# Patient Record
Sex: Female | Born: 1960 | Race: White | Hispanic: No | Marital: Single | Smoking: Current every day smoker
Health system: Southern US, Community
[De-identification: ages and names within clinical notes are randomized; demographics above are authoritative.]

## PROBLEM LIST (undated history)

## (undated) DIAGNOSIS — K8020 Calculus of gallbladder without cholecystitis without obstruction: Secondary | ICD-10-CM

## (undated) DIAGNOSIS — M810 Age-related osteoporosis without current pathological fracture: Secondary | ICD-10-CM

## (undated) DIAGNOSIS — I7300 Raynaud's syndrome without gangrene: Secondary | ICD-10-CM

## (undated) DIAGNOSIS — G35 Multiple sclerosis: Secondary | ICD-10-CM

## (undated) DIAGNOSIS — M069 Rheumatoid arthritis, unspecified: Secondary | ICD-10-CM

---

## 2018-03-20 ENCOUNTER — Emergency Department (HOSPITAL_COMMUNITY): Payer: Medicaid Other

## 2018-03-20 ENCOUNTER — Emergency Department (HOSPITAL_COMMUNITY)
Admission: EM | Admit: 2018-03-20 | Discharge: 2018-03-20 | Disposition: A | Discharge status: Home / Self Care | Payer: Medicaid Other | Attending: Emergency Medicine | Admitting: Emergency Medicine

## 2018-03-20 ENCOUNTER — Encounter (HOSPITAL_COMMUNITY): Payer: Self-pay | Admitting: Emergency Medicine

## 2018-03-20 DIAGNOSIS — G35 Multiple sclerosis: Secondary | ICD-10-CM | POA: Diagnosis not present

## 2018-03-20 DIAGNOSIS — F17210 Nicotine dependence, cigarettes, uncomplicated: Secondary | ICD-10-CM | POA: Insufficient documentation

## 2018-03-20 DIAGNOSIS — Z79899 Other long term (current) drug therapy: Secondary | ICD-10-CM | POA: Insufficient documentation

## 2018-03-20 DIAGNOSIS — R531 Weakness: Secondary | ICD-10-CM | POA: Diagnosis present

## 2018-03-20 HISTORY — DX: Calculus of gallbladder without cholecystitis without obstruction: K80.20

## 2018-03-20 HISTORY — DX: Multiple sclerosis: G35

## 2018-03-20 HISTORY — DX: Rheumatoid arthritis, unspecified: M06.9

## 2018-03-20 HISTORY — DX: Age-related osteoporosis without current pathological fracture: M81.0

## 2018-03-20 HISTORY — DX: Raynaud's syndrome without gangrene: I73.00

## 2018-03-20 LAB — CBC WITH DIFFERENTIAL/PLATELET
ABS IMMATURE GRANULOCYTES: 0.02 10*3/uL (ref 0.00–0.07)
Basophils Absolute: 0 10*3/uL (ref 0.0–0.1)
Basophils Relative: 0 %
Eosinophils Absolute: 0.2 10*3/uL (ref 0.0–0.5)
Eosinophils Relative: 2 %
HCT: 42.8 % (ref 36.0–46.0)
Hemoglobin: 13.7 g/dL (ref 12.0–15.0)
Immature Granulocytes: 0 %
Lymphocytes Relative: 42 %
Lymphs Abs: 3.2 10*3/uL (ref 0.7–4.0)
MCH: 30.8 pg (ref 26.0–34.0)
MCHC: 32 g/dL (ref 30.0–36.0)
MCV: 96.2 fL (ref 80.0–100.0)
Monocytes Absolute: 0.7 10*3/uL (ref 0.1–1.0)
Monocytes Relative: 10 %
Neutro Abs: 3.6 10*3/uL (ref 1.7–7.7)
Neutrophils Relative %: 46 %
Platelets: 383 10*3/uL (ref 150–400)
RBC: 4.45 MIL/uL (ref 3.87–5.11)
RDW: 13 % (ref 11.5–15.5)
WBC: 7.8 10*3/uL (ref 4.0–10.5)
nRBC: 0 % (ref 0.0–0.2)

## 2018-03-20 LAB — COMPREHENSIVE METABOLIC PANEL
ALT: 13 U/L (ref 0–44)
AST: 16 U/L (ref 15–41)
Albumin: 4 g/dL (ref 3.5–5.0)
Alkaline Phosphatase: 32 U/L — ABNORMAL LOW (ref 38–126)
Anion gap: 8 (ref 5–15)
BILIRUBIN TOTAL: 0.5 mg/dL (ref 0.3–1.2)
BUN: 24 mg/dL — ABNORMAL HIGH (ref 6–20)
CHLORIDE: 111 mmol/L (ref 98–111)
CO2: 21 mmol/L — ABNORMAL LOW (ref 22–32)
Calcium: 8.7 mg/dL — ABNORMAL LOW (ref 8.9–10.3)
Creatinine, Ser: 0.81 mg/dL (ref 0.44–1.00)
GFR calc Af Amer: >60 mL/min (ref >60)
Glucose, Bld: 82 mg/dL (ref 70–99)
Potassium: 4 mmol/L (ref 3.5–5.1)
Sodium: 140 mmol/L (ref 135–145)
Total Protein: 6.4 g/dL — ABNORMAL LOW (ref 6.5–8.1)

## 2018-03-20 MED ORDER — HYDROCODONE-ACETAMINOPHEN 5-325 MG PO TABS
1.0000 | ORAL_TABLET | Freq: Once | ORAL | Status: AC
  Administered 2018-03-20: 1 via ORAL
  Filled 2018-03-20: qty 1

## 2018-03-20 MED ORDER — GABAPENTIN 300 MG PO CAPS: 300.0000 mg | ORAL_CAPSULE | Freq: Four times a day (QID) | ORAL | 0 refills | Status: AC

## 2018-03-20 MED ORDER — HYDROCODONE-ACETAMINOPHEN 5-325 MG PO TABS: 1.0000 | ORAL_TABLET | Freq: Four times a day (QID) | ORAL | 0 refills | Status: DC | PRN

## 2018-03-20 MED ORDER — KETOROLAC TROMETHAMINE 30 MG/ML IJ SOLN
30.0000 mg | Freq: Once | INTRAMUSCULAR | Status: DC
  Filled 2018-03-20: qty 1

## 2018-03-20 NOTE — ED Triage Notes (Signed)
Pt states she feels as if she is having an MS flare.  Takes gabapentin for MS.  Is having back pain, bilateral leg pain, with generalized weakness.

## 2018-03-20 NOTE — ED Provider Notes (Signed)
[facility] EMERGENCY DEPARTMENT Provider Note   CSN: [id number] Arrival date & time: 03/20/18  1843     History   Chief Complaint Chief Complaint  Patient presents with  . Weakness    HPI Allison Rich is a 58 y.o. female.  HPI Patient presents with what she thinks is an MS flare.  States she has had MS for 30 years.  Previously managed in [place] where she was living.  Also history of osteoporosis.  States her right lower leg is feeling weak and will spasm at times.  Also has pain in the lower back.  No trauma.  States her right face is also feeling numb.  She is on Neurontin at baseline.  Not on any disease modifying drugs.  States recently moved here and does not have a neurologist here yet.  States she cannot do steroids because she will react to them.  States she will occasionally get mild flares that she just waits out.  States it is usually not this bad.  No fevers or chills.  Pain is dull.  No loss of bladder or bowel control.  States she is having some more difficulty walking due to the right lower leg. Past Medical History:  Diagnosis Date  . Cholelithiasis   . Multiple sclerosis (HCC)   . Osteoporosis   . Raynaud disease   . Rheumatoid arthritis (HCC)     There are no active problems to display for this patient.   Past Surgical History:  Procedure Laterality Date  . CESAREAN SECTION       OB History   No obstetric history on file.      Home Medications    Prior to Admission medications   Medication Sig Start Date End Date Taking? Authorizing Provider  ibuprofen (ADVIL,MOTRIN) 200 MG tablet Take 800 mg by mouth 3 (three) times daily.   Yes [provider]  lisinopril (PRINIVIL,ZESTRIL) 10 MG tablet Take 10 mg by mouth every morning.   Yes [provider]  gabapentin (NEURONTIN) 300 MG capsule Take 1 capsule (300 mg total) by mouth 4 (four) times daily. 03/20/18   Benjiman Core, MD  HYDROcodone-acetaminophen (NORCO/VICODIN) 5-325  MG tablet Take 1-2 tablets by mouth every 6 (six) hours as needed. 03/20/18   Benjiman Core, MD    Family History History reviewed. No pertinent family history.  Social History Social History   Tobacco Use  . Smoking status: Current Every Day Smoker    Packs/day: 1.00    Types: Cigarettes  . Smokeless tobacco: Never Used  Substance Use Topics  . Alcohol use: Never    Frequency: Never  . Drug use: Never     Allergies   Penicillins and Prednisone   Review of Systems Review of Systems  Constitutional: Negative for chills and fever.  HENT: Negative for congestion.   Respiratory: Negative for shortness of breath.   Cardiovascular: Negative for chest pain.  Gastrointestinal: Negative for abdominal pain.  Genitourinary: Negative for flank pain.  Musculoskeletal: Positive for back pain.  Skin: Negative for wound.  Neurological: Positive for speech difficulty, weakness and numbness.  Psychiatric/Behavioral: Negative for confusion.     Physical Exam Updated Vital Signs BP 99/68   Pulse 70   Temp 97.7 F (36.5 C) (Oral)   Resp 20   Ht 5' (1.524 m)   Wt 48.1 kg   SpO2 95%   BMI 20.70 kg/m   Physical Exam HENT:     Head: Atraumatic.  Eyes:  Extraocular Movements: Extraocular movements intact.     Pupils: Pupils are equal, round, and reactive to light.  Neck:     Musculoskeletal: Neck supple.  Cardiovascular:     Rate and Rhythm: Normal rate.  Pulmonary:     Breath sounds: No stridor. No rhonchi.  Abdominal:     Tenderness: There is no abdominal tenderness.  Musculoskeletal:     Comments: Some lower thoracic spine tenderness.  No deformity.  Skin:    General: Skin is warm.     Capillary Refill: Capillary refill takes less than 2 seconds.  Neurological:     Mental Status: She is alert and oriented to person, place, and time.     Comments: Paresthesias to right face.  Face symmetric.  Good grip strength bilaterally.  Some decreased strength and  coordination in right lower extremity.  Psychiatric:        Mood and Affect: Mood normal.      ED Treatments / Results  Labs (all labs ordered are listed, but only abnormal results are displayed) Labs Reviewed  COMPREHENSIVE METABOLIC PANEL - Abnormal; Notable for the following components:      Result Value   CO2 21 (*)    BUN 24 (*)    Calcium 8.7 (*)    Total Protein 6.4 (*)    Alkaline Phosphatase 32 (*)    All other components within normal limits  CBC WITH DIFFERENTIAL/PLATELET    EKG None  Radiology Dg Thoracic Spine 2 View  Result Date: 03/20/2018 CLINICAL DATA:  Pain, weakness, question multiple sclerosis flare, having back and BILATERAL leg pain with generalized weakness EXAM: THORACIC SPINE 2 VIEWS COMPARISON:  None FINDINGS: Twelve pairs of ribs. Bones demineralized. Levoconvex thoracolumbar scoliosis with apex at T10. Vertebral body and disc space heights maintained. No fracture, subluxation, or bone destruction. IMPRESSION: Levoconvex thoracolumbar scoliosis and osseous demineralization. No acute abnormalities. Electronically Signed   By: Ulyses Southward M.D.   On: 03/20/2018 19:59   Dg Lumbar Spine Complete  Result Date: 03/20/2018 CLINICAL DATA:  Back pain and BILATERAL leg pain generalized weakness, multiple sclerosis flare EXAM: LUMBAR SPINE - COMPLETE 4+ VIEW COMPARISON:  None FINDINGS: 5 non-rib-bearing lumbar vertebra. Bones demineralized. Minimal disc space narrowing L4-L5. Vertebral body and disc space heights otherwise maintained. No fracture, subluxation, or bone destruction. No spondylolysis. SI joints preserved. IMPRESSION: Osseous demineralization with minimal degenerative disc disease changes at L4-L5. No acute abnormalities. Electronically Signed   By: Ulyses Southward M.D.   On: 03/20/2018 20:01    Procedures Procedures (including critical care time)  Medications Ordered in ED Medications  HYDROcodone-acetaminophen (NORCO/VICODIN) 5-325 MG per tablet 1  tablet (1 tablet Oral Given 03/20/18 2133)     Initial Impression / Assessment and Plan / ED Course  I have reviewed the triage vital signs and the nursing notes.  Pertinent labs & imaging results that were available during my care of the patient were reviewed by me and considered in my medical decision making (see chart for details).     Patient with weakness of right lower extremity.  Also right face.  I think this is likely an MS exacerbation.  She is on no disease modifying medications and states she cannot do steroids.  I feels the patient benefit for admission to hospital likely at [facility] for further neurologic treatment and specialty evaluation since MRI is not easily available right now.  Patient states she cannot do it because she has a daughter with extra needs  at home.  Just asked for neurology follow-up.  Will increase patient's Neurontin and will give a few pills of pain medicine.  Drug database reviewed.  Given Dr. Gerilyn Pilgrim to follow with.  Discharge home.  Can return at any time for further treatment.  Final Clinical Impressions(s) / ED Diagnoses   Final diagnoses:  Multiple sclerosis exacerbation [facility])    ED Discharge Orders         Ordered    gabapentin (NEURONTIN) 300 MG capsule  4 times daily     03/20/18 2122    HYDROcodone-acetaminophen (NORCO/VICODIN) 5-325 MG tablet  Every 6 hours PRN     03/20/18 2122           Benjiman Core, MD 03/20/18 2139

## 2018-03-20 NOTE — ED Notes (Signed)
Advised patient not to drive after discharge due to narcotic medication administration. Also advised patient not to drive while taking prescription pain medication. Patient verbalized understanding. Discharged via wheelchair with family to drive her home.

## 2018-03-20 NOTE — ED Notes (Signed)
Gave patient water to drink as requested and approved by Dr Rubin Payor. Also gave family member water as requested.

## 2018-03-28 ENCOUNTER — Other Ambulatory Visit: Payer: Self-pay

## 2018-03-28 ENCOUNTER — Encounter (HOSPITAL_COMMUNITY): Payer: Self-pay

## 2018-03-28 ENCOUNTER — Emergency Department (HOSPITAL_COMMUNITY): Payer: Medicaid Other

## 2018-03-28 ENCOUNTER — Emergency Department (HOSPITAL_COMMUNITY)
Admission: EM | Admit: 2018-03-28 | Discharge: 2018-03-28 | Disposition: A | Discharge status: Home / Self Care | Payer: Medicaid Other | Attending: Emergency Medicine | Admitting: Emergency Medicine

## 2018-03-28 DIAGNOSIS — Y9389 Activity, other specified: Secondary | ICD-10-CM | POA: Diagnosis not present

## 2018-03-28 DIAGNOSIS — Y999 Unspecified external cause status: Secondary | ICD-10-CM | POA: Diagnosis not present

## 2018-03-28 DIAGNOSIS — W230XXA Caught, crushed, jammed, or pinched between moving objects, initial encounter: Secondary | ICD-10-CM | POA: Diagnosis not present

## 2018-03-28 DIAGNOSIS — Y92009 Unspecified place in unspecified non-institutional (private) residence as the place of occurrence of the external cause: Secondary | ICD-10-CM | POA: Diagnosis not present

## 2018-03-28 DIAGNOSIS — S6992XA Unspecified injury of left wrist, hand and finger(s), initial encounter: Secondary | ICD-10-CM | POA: Diagnosis present

## 2018-03-28 DIAGNOSIS — S63502A Unspecified sprain of left wrist, initial encounter: Secondary | ICD-10-CM | POA: Insufficient documentation

## 2018-03-28 DIAGNOSIS — F17210 Nicotine dependence, cigarettes, uncomplicated: Secondary | ICD-10-CM | POA: Insufficient documentation

## 2018-03-28 DIAGNOSIS — Z79899 Other long term (current) drug therapy: Secondary | ICD-10-CM | POA: Diagnosis not present

## 2018-03-28 MED ORDER — HYDROCODONE-ACETAMINOPHEN 5-325 MG PO TABS: ORAL_TABLET | ORAL | 0 refills | Status: AC

## 2018-03-28 MED ORDER — HYDROCODONE-ACETAMINOPHEN 5-325 MG PO TABS
1.0000 | ORAL_TABLET | Freq: Once | ORAL | Status: AC
  Administered 2018-03-28: 1 via ORAL
  Filled 2018-03-28: qty 1
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
## 2018-03-28 NOTE — Discharge Instructions (Addendum)
Elevate your hand when possible.  Wear the brace for at least 1 week for support.  Continue taking your ibuprofen as directed.  Aloe up with your primary doctor or the orthopedic doctor listed in 1 week if not improving.
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
## 2018-04-06 ENCOUNTER — Other Ambulatory Visit: Payer: Self-pay | Admitting: Neurology

## 2018-04-06 ENCOUNTER — Other Ambulatory Visit (HOSPITAL_COMMUNITY): Payer: Self-pay | Admitting: Neurology

## 2018-04-06 DIAGNOSIS — G35 Multiple sclerosis: Secondary | ICD-10-CM

## 2018-04-08 ENCOUNTER — Telehealth: Payer: Self-pay | Admitting: Orthopaedic Surgery

## 2018-04-08 NOTE — Telephone Encounter (Signed)
We were sent referral on this patient from [facility].  This was referred by Dr. Hilda Lias.  He declined the referral and wrote a note stating this patient needed neurology.  I called back to the Health Department and spoke with Diane in Triage.  I explained that Dr. Hilda Lias reviewed the notes and it was recommended that she go to neurology.  Ms. Allison Rich called this morning very upset that she was denied an appointment here.  She asked to speak to my office manager.  I told her that I would speak to our practice administrator, Cherre Huger and I would have Toniann Fail give her at call.  I have spoken with Toniann Fail and she will give Ms. Allison Rich a call.

## 2018-04-08 NOTE — Telephone Encounter (Signed)
I spoke back to [place].  She did call and speak with Ms. Adron Bene.  Patient wants to be seen for her left wrist.  She told Toniann Fail that she was seen in the ED for her wrist.  I told Toniann Fail that we would call her and schedule the appointment.

## 2018-04-12 ENCOUNTER — Ambulatory Visit: Payer: Self-pay | Admitting: Orthopedic Surgery

## 2018-04-12 ENCOUNTER — Encounter: Payer: Self-pay | Admitting: Orthopedic Surgery

## 2018-04-19 ENCOUNTER — Ambulatory Visit: Payer: Self-pay | Admitting: Orthopedic Surgery

## 2018-04-19 ENCOUNTER — Encounter: Payer: Self-pay | Admitting: Orthopedic Surgery

## 2018-04-21 ENCOUNTER — Other Ambulatory Visit (HOSPITAL_COMMUNITY): Payer: Self-pay | Admitting: Neurology

## 2018-04-21 DIAGNOSIS — G35 Multiple sclerosis: Secondary | ICD-10-CM

## 2018-04-23 ENCOUNTER — Encounter (HOSPITAL_COMMUNITY): Payer: Self-pay

## 2018-04-23 ENCOUNTER — Ambulatory Visit (HOSPITAL_COMMUNITY): Payer: Medicaid Other | Attending: Neurology

## 2018-04-23 ENCOUNTER — Ambulatory Visit (HOSPITAL_COMMUNITY): Payer: Medicaid Other

## 2018-04-27 ENCOUNTER — Encounter (INDEPENDENT_AMBULATORY_CARE_PROVIDER_SITE_OTHER): Payer: Self-pay | Admitting: Internal Medicine

## 2018-05-25 ENCOUNTER — Telehealth (HOSPITAL_COMMUNITY): Payer: Self-pay

## 2018-05-25 NOTE — Telephone Encounter (Signed)
Pt called to ask if we have a referral for a Wheelchair evaluation. Pt was informed we do not have it yet. She will call Md office and get them to resend it.

## 2018-06-01 ENCOUNTER — Telehealth (HOSPITAL_COMMUNITY): Payer: Self-pay | Admitting: Occupational Therapy

## 2018-06-01 NOTE — Telephone Encounter (Signed)
Allison Rich was contacted today regarding transition if in-person OP Rehab Services to telehealth due to Covid-19. Pt consented to telehealth services, educated on MyChart signup, Webex [employer], and was agreeable to receive information via (text/email) regarding telehealth services. Pt consented and was scheduled for appointment.

## 2018-06-08 ENCOUNTER — Ambulatory Visit (HOSPITAL_COMMUNITY): Payer: Medicaid Other | Attending: Neurology | Admitting: Occupational Therapy

## 2018-06-08 ENCOUNTER — Other Ambulatory Visit: Payer: Self-pay

## 2018-06-08 ENCOUNTER — Telehealth (HOSPITAL_COMMUNITY): Payer: Self-pay | Admitting: Occupational Therapy

## 2018-06-08 ENCOUNTER — Encounter (HOSPITAL_COMMUNITY): Payer: Self-pay | Admitting: Occupational Therapy

## 2018-06-08 DIAGNOSIS — R278 Other lack of coordination: Secondary | ICD-10-CM | POA: Diagnosis present

## 2018-06-08 DIAGNOSIS — R29898 Other symptoms and signs involving the musculoskeletal system: Secondary | ICD-10-CM | POA: Insufficient documentation

## 2018-06-08 DIAGNOSIS — M6281 Muscle weakness (generalized): Secondary | ICD-10-CM | POA: Diagnosis present

## 2018-06-08 NOTE — Telephone Encounter (Signed)
5/5 pt called stating she will let us know during this Telehealth Eval visit if she wants [facility] or not.

## 2018-06-08 NOTE — Telephone Encounter (Signed)
She had down loaded the app for Telehealth and stating she was ready for todays t visit.
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
## 2018-06-09 ENCOUNTER — Ambulatory Visit (HOSPITAL_COMMUNITY): Payer: Medicaid Other

## 2018-06-11 ENCOUNTER — Ambulatory Visit (HOSPITAL_COMMUNITY): Admission: RE | Admit: 2018-06-11 | Payer: Medicaid Other | Source: Ambulatory Visit

## 2018-06-11 ENCOUNTER — Encounter (HOSPITAL_COMMUNITY): Payer: Self-pay

## 2018-06-11 ENCOUNTER — Ambulatory Visit (HOSPITAL_COMMUNITY): Payer: Medicaid Other | Attending: Neurology

## 2018-06-30 ENCOUNTER — Telehealth (HOSPITAL_COMMUNITY): Payer: Self-pay | Admitting: Occupational Therapy

## 2018-06-30 NOTE — Telephone Encounter (Signed)
:  Pt called to thank our office for the Wheelchair Eval report that was faxed to them yesterday.

## 2020-01-14 IMAGING — DX DG HAND COMPLETE 3+V*L*
3 series · 3 of 3 positions shown · non-contrast
Comparison: None.

CLINICAL DATA: Left wrist slammed in the door 1 hour ago.

EXAM:
LEFT WRIST - COMPLETE 3+ VIEW; LEFT HAND - COMPLETE 3+ VIEW

[hand pa]
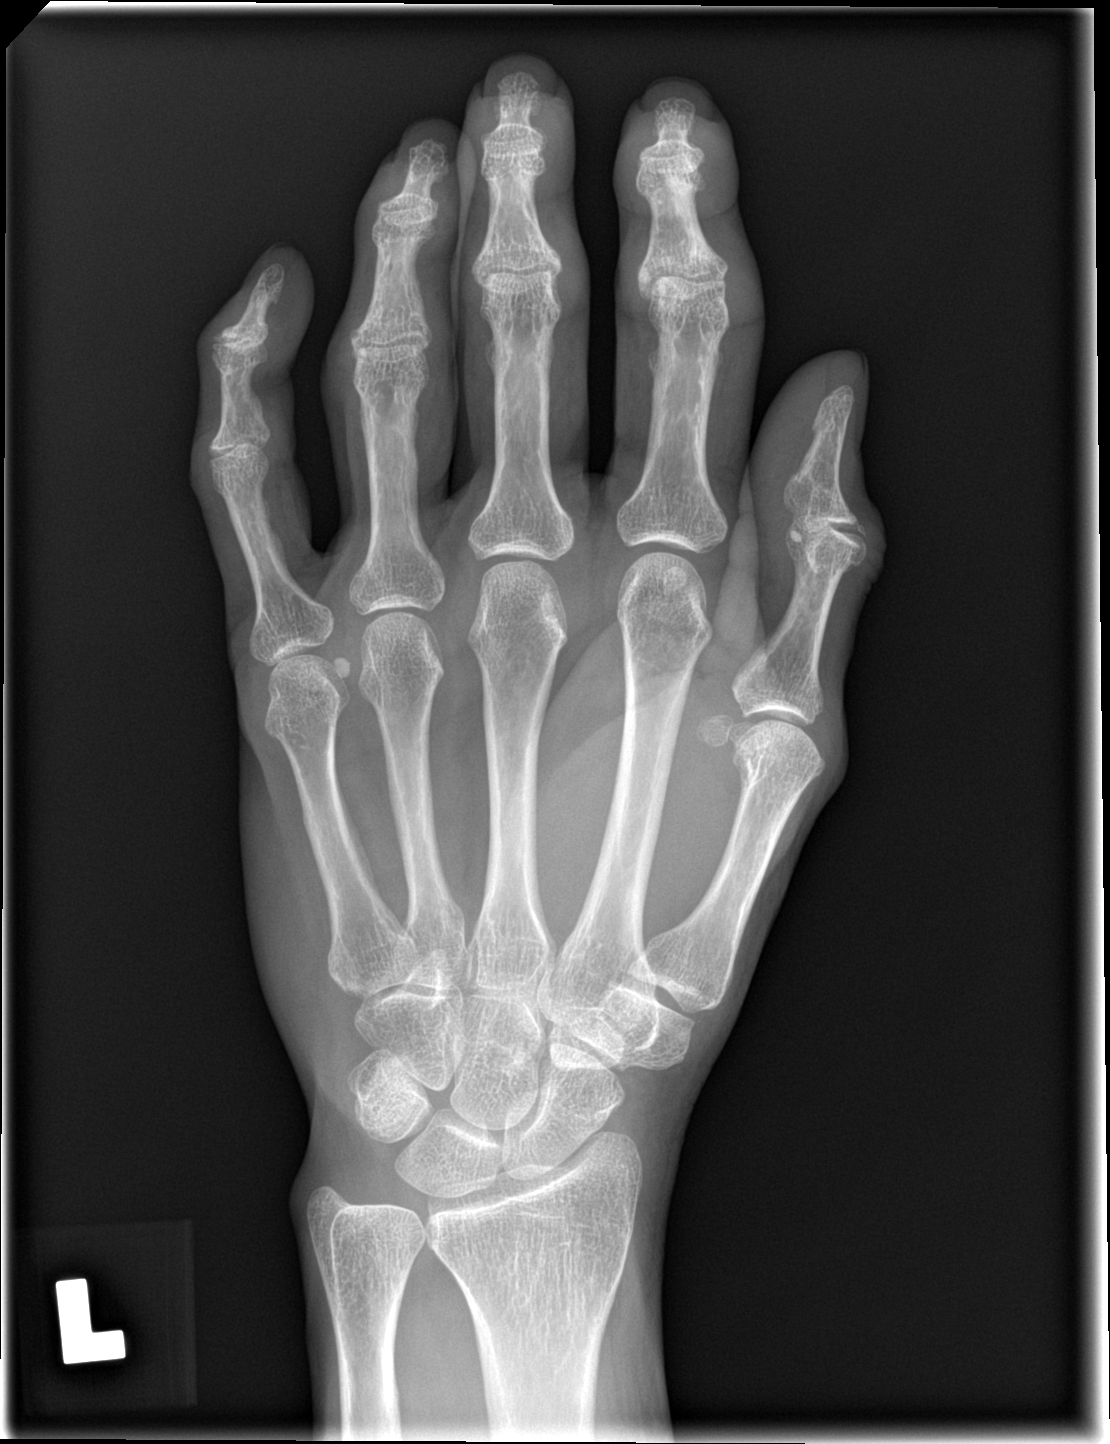

[hand obl]
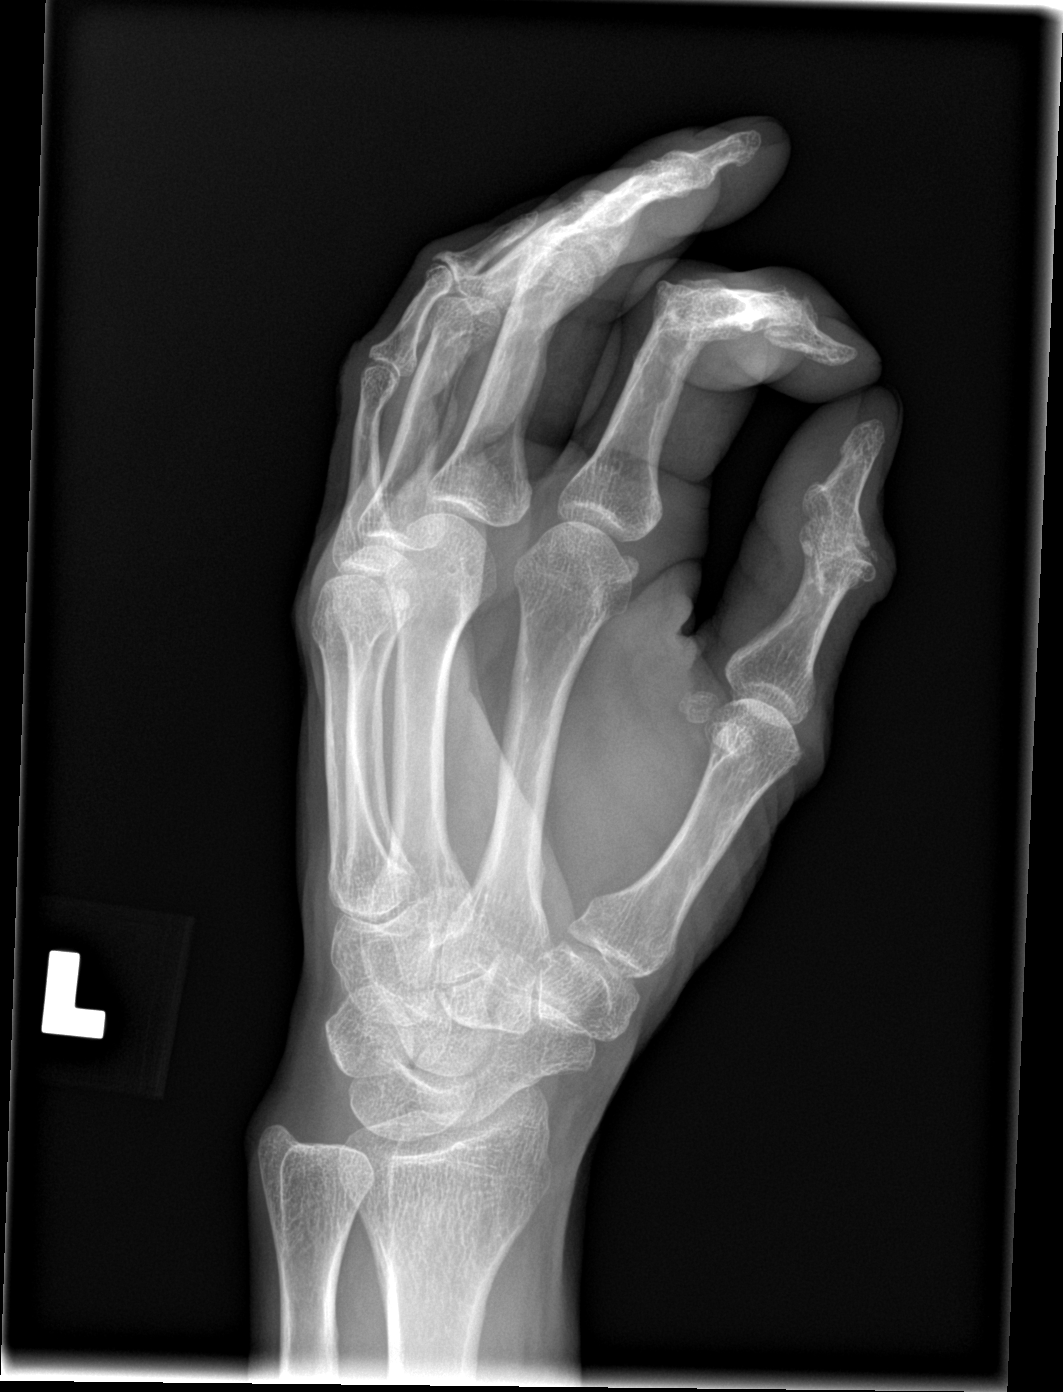

[hand lat]
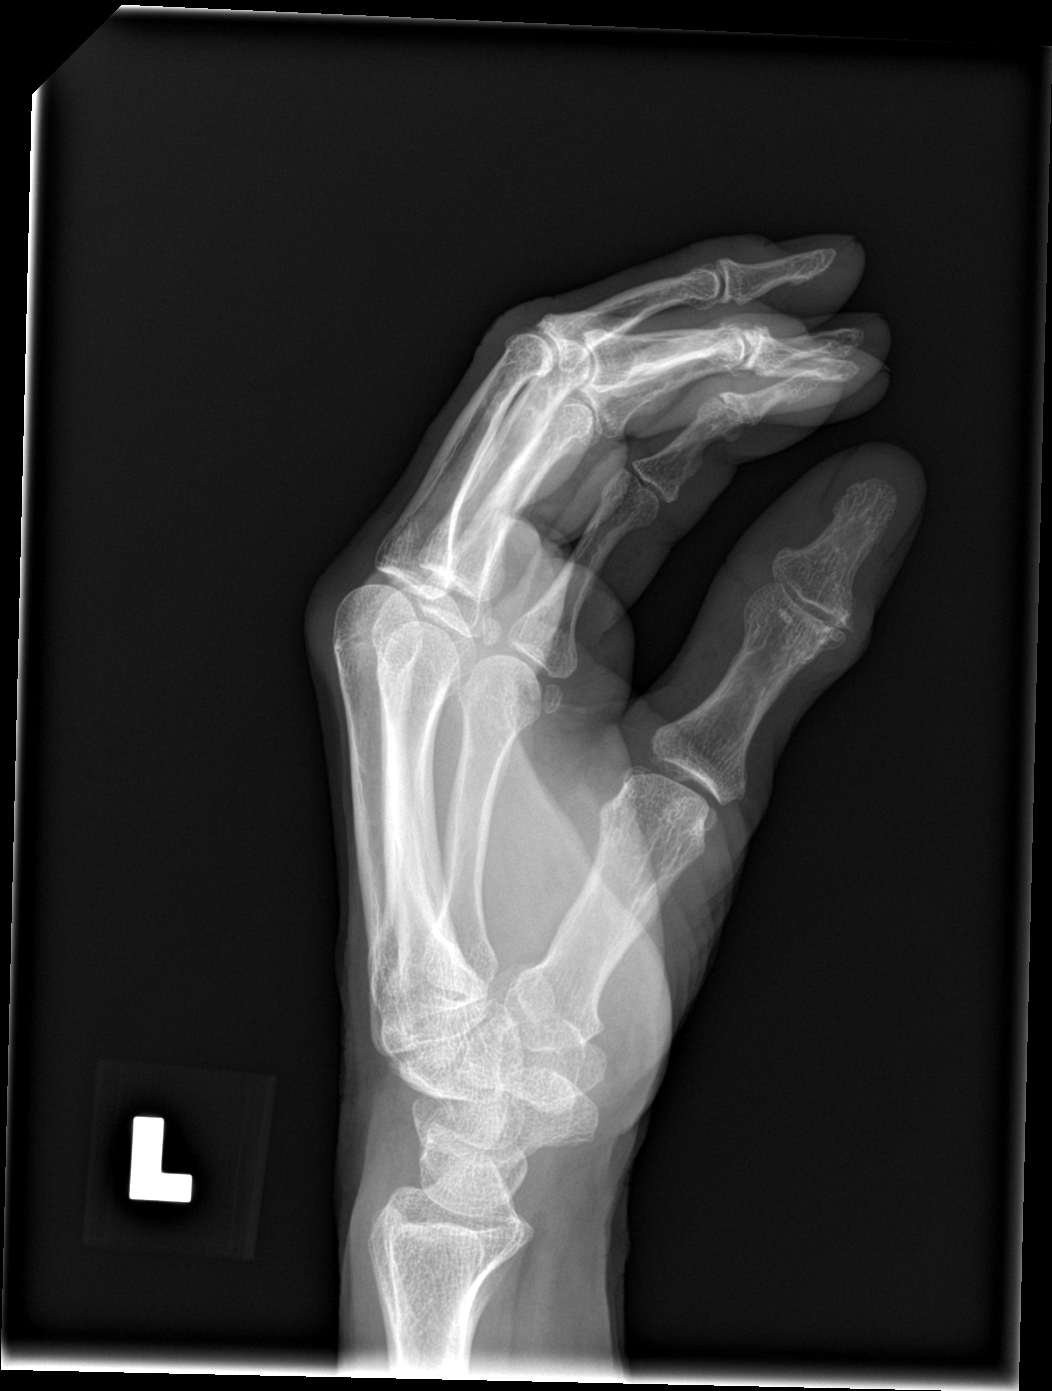

[3 of 3 positions shown; findings below may reference images not displayed]

FINDINGS: Three views of the left hand and four views of the left wrist are
obtained. Moderate degenerative changes demonstrated in the
interphalangeal joints, most prominent distally. No evidence of
acute fracture or dislocation involving the left hand or the left
wrist. No focal bone lesion or bone destruction. Bone cortex appears
intact. Soft tissues are unremarkable.
IMPRESSION: Degenerative changes in the interphalangeal joints. No acute bony
abnormalities involving the left hand or left wrist.
